# Patient Record
Sex: Female | Born: 1955 | Hispanic: Yes | Marital: Married | Smoking: Never smoker
Health system: Southern US, Community
[De-identification: ages and names within clinical notes are randomized; demographics above are authoritative.]

## PROBLEM LIST (undated history)

## (undated) DIAGNOSIS — E119 Type 2 diabetes mellitus without complications: Secondary | ICD-10-CM

## (undated) DIAGNOSIS — M1990 Unspecified osteoarthritis, unspecified site: Secondary | ICD-10-CM

---

## 2004-05-21 ENCOUNTER — Ambulatory Visit: Payer: Self-pay

## 2004-06-12 ENCOUNTER — Emergency Department: Payer: Self-pay | Admitting: Emergency Medicine

## 2005-06-03 ENCOUNTER — Ambulatory Visit: Payer: Self-pay

## 2006-10-20 ENCOUNTER — Ambulatory Visit: Payer: Self-pay

## 2007-12-06 ENCOUNTER — Ambulatory Visit: Payer: Self-pay

## 2009-03-19 ENCOUNTER — Ambulatory Visit: Payer: Self-pay

## 2009-09-17 ENCOUNTER — Ambulatory Visit: Payer: Self-pay

## 2009-10-10 LAB — PATHOLOGY REPORT

## 2010-04-30 ENCOUNTER — Ambulatory Visit: Payer: Self-pay

## 2010-05-06 ENCOUNTER — Ambulatory Visit: Payer: Self-pay

## 2010-11-24 ENCOUNTER — Emergency Department: Payer: Self-pay | Admitting: *Deleted

## 2010-11-30 ENCOUNTER — Inpatient Hospital Stay: Payer: Self-pay | Admitting: Internal Medicine

## 2011-06-24 ENCOUNTER — Ambulatory Visit: Payer: Self-pay

## 2012-06-28 ENCOUNTER — Ambulatory Visit: Payer: Self-pay | Admitting: Family Medicine

## 2012-08-14 IMAGING — CT CT STONE STUDY
1 of 2 series · 15 of 32 positions shown, 19 images · non-contrast
Comparison: none

REASON FOR EXAM: left lower quadrant pain
COMMENTS:

PROCEDURE:     CT  - CT ABDOMEN /PELVIS WO (STONE)  - November 30, 2010  [DATE]
RESULT:     Comparison: 11/24/2010
TECHNIQUE: Multiple axial images from the lung bases to the symphysis pubis
were obtained without oral and without intravenous contrast.

[Series 2: stone · axial · 0.84mm/px · z∈[-506,-77]mm · 15 of 161 slices shown, 19 images]
[im 12/161  soft-tissue]
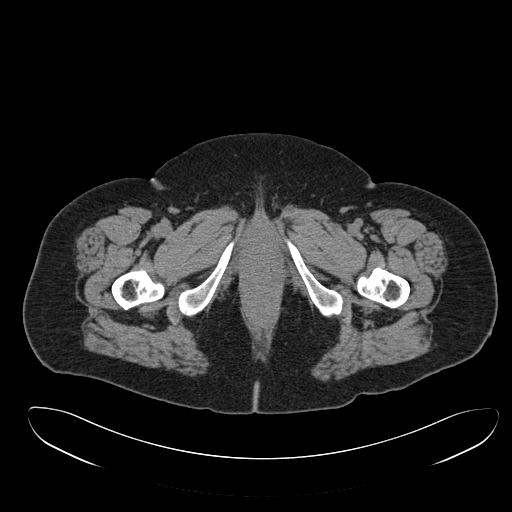
[im 12/161  bone]
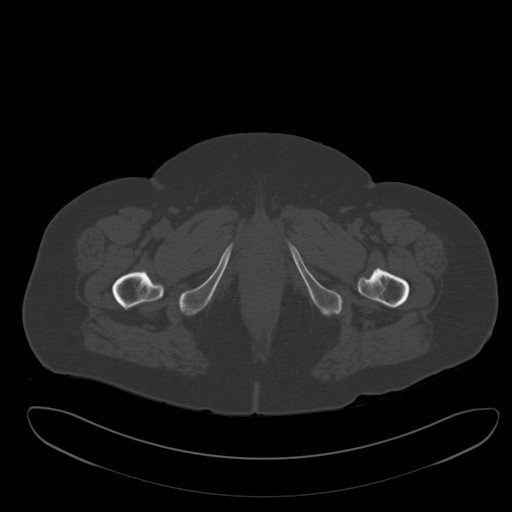
[im 24/161  soft-tissue]
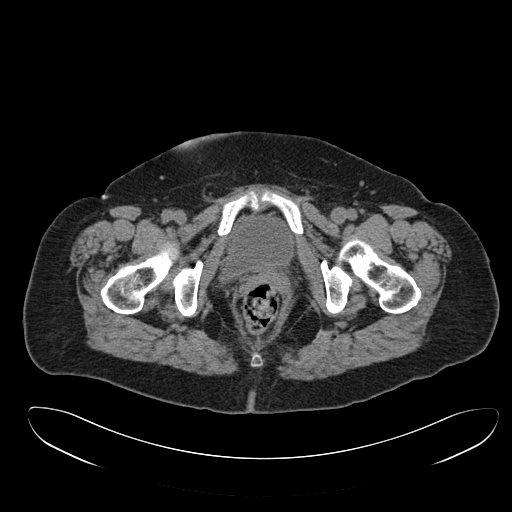
[im 36/161  soft-tissue]
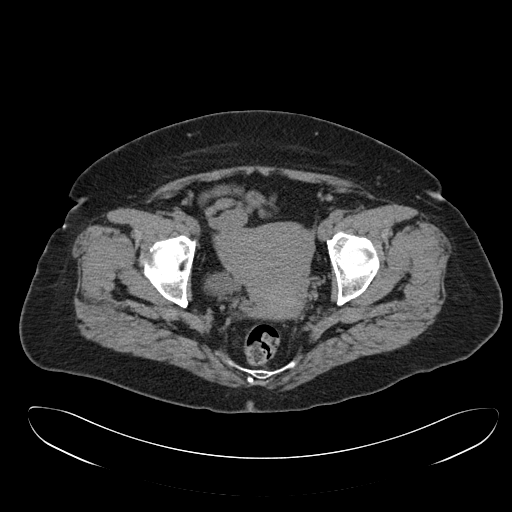
[im 48/161  soft-tissue]
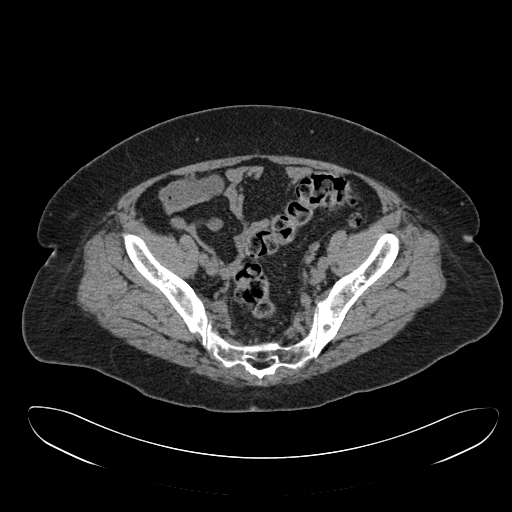
[im 60/161  soft-tissue]
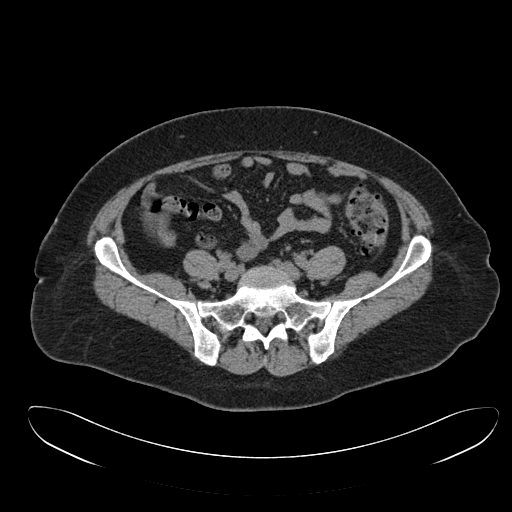
[im 72/161  soft-tissue]
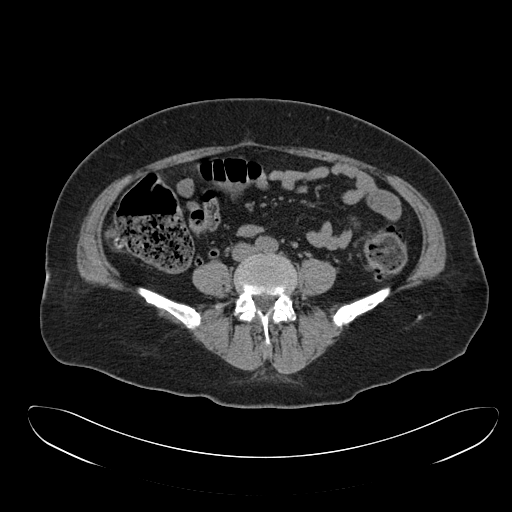
[im 83/161  soft-tissue]
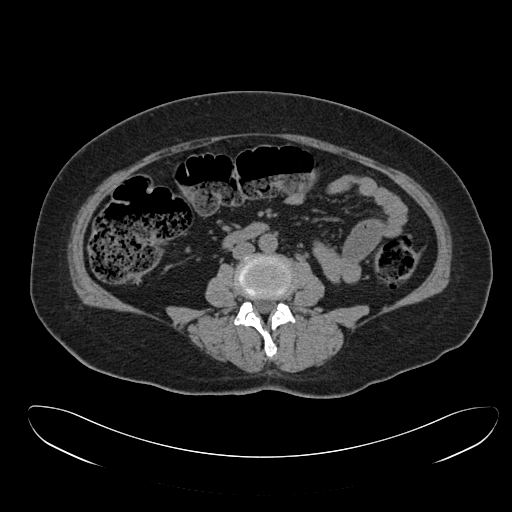
[im 95/161  soft-tissue]
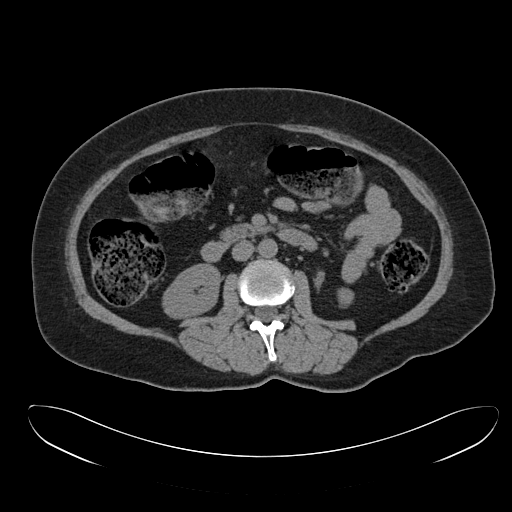
[im 107/161  soft-tissue]
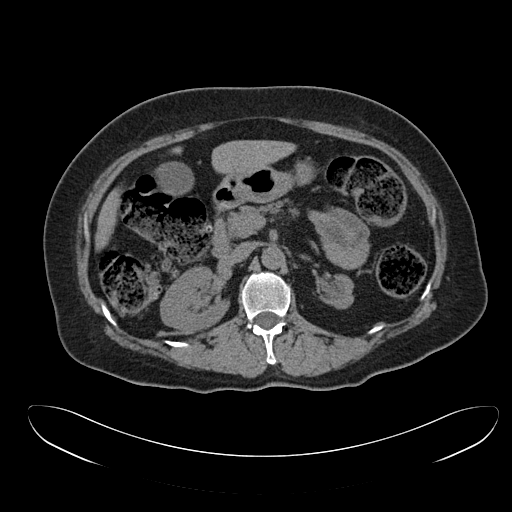
[im 107/161  bone]
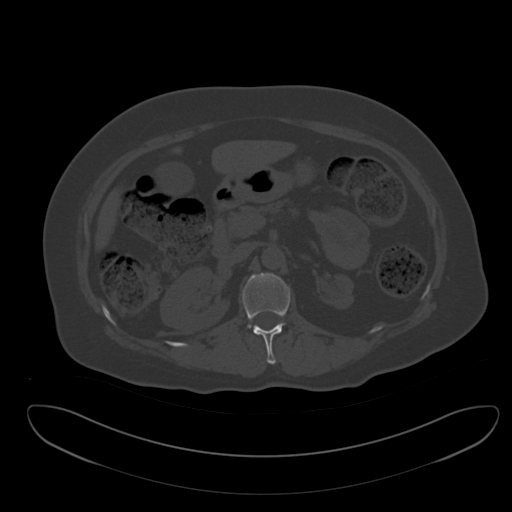
[im 119/161  soft-tissue]
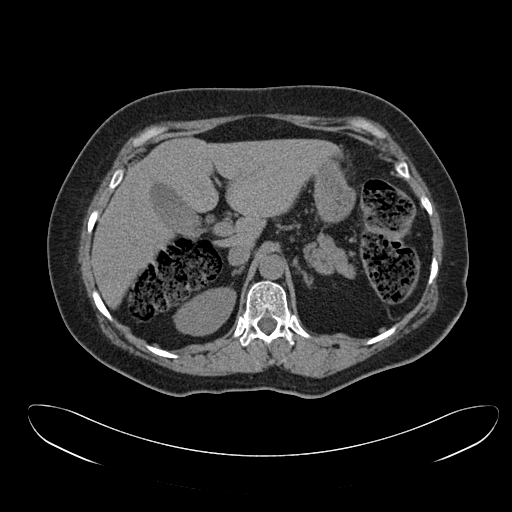
[im 131/161  soft-tissue]
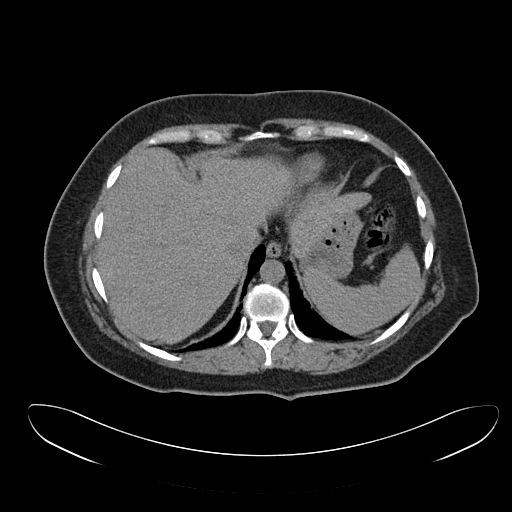
[im 137/161  lung]
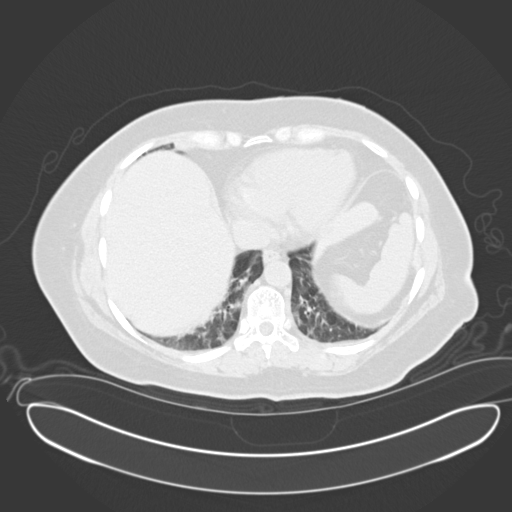
[im 143/161  soft-tissue]
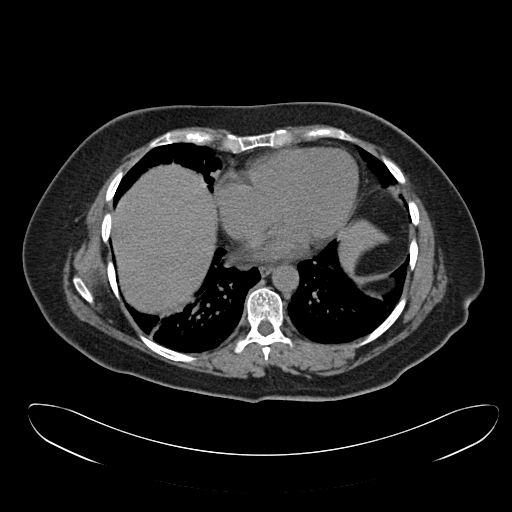
[im 143/161  lung]
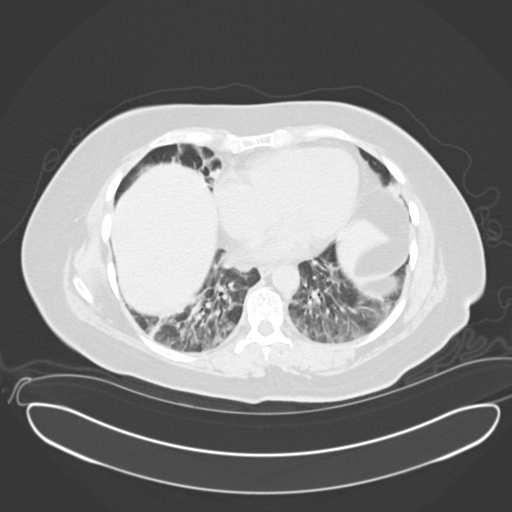
[im 149/161  lung]
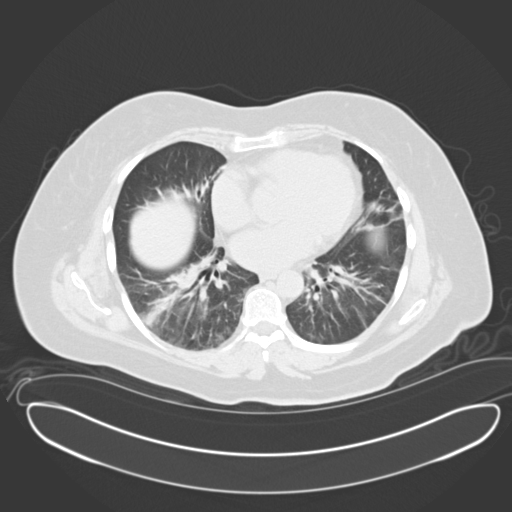
[im 155/161  soft-tissue]
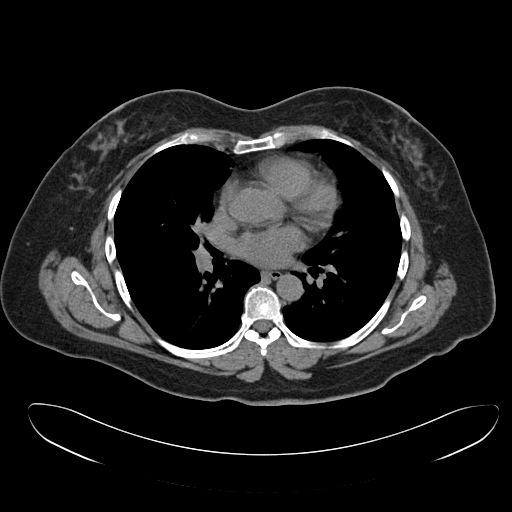
[im 155/161  lung]
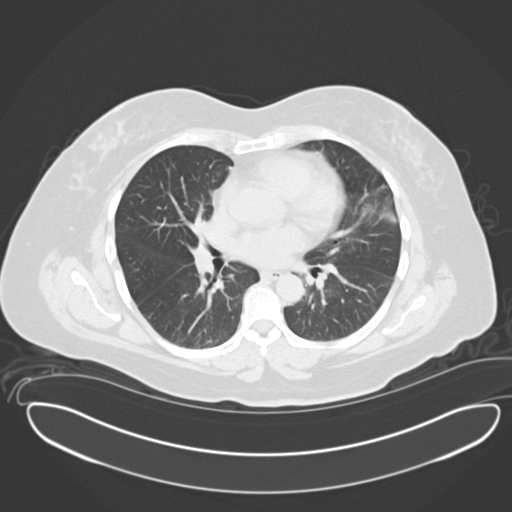

[15 of 32 positions shown; findings below may reference images not displayed]

FINDINGS: Mild basilar opacities likely represent atelectasis.

Lack of intravenous contrast limits evaluation of the solid abdominal
organs.  Grossly, the liver, spleen, adrenals, gallbladder, and pancreas are
unremarkable. No renal calculi or hydronephrosis. The left kidney is
atrophic. Low-attenuation lesions in the left kidney likely represent cysts.
The others are too small to characterize. The left renal pelvis and proximal
left ureter are mildly dilated. This dilatation is decreased from prior.

The small and large bowel are normal in caliber. There is mild
diverticulosis of the sigmoid and descending colon. There is suggestion of
minimal stranding adjacent to the junction of the sigmoid and descending
colon. Mild diverticulosis also seen in the descending colon. The appendix
is normal.

No aggressive lytic or sclerotic osseous lesions are identified.
IMPRESSION: 1. Diverticulosis of the colon, with suggestion of minimal diverticulitis at
the junction of the sigmoid and descending colon.
2. Atrophic left kidney. The left renal pelvis and proximal ureter are
mildly dilated without definite obstructing calculus. The dilatation is
decreased from prior. This may be related to resolving infection or passed
stone. If there is continued clinical concern, dedicated contrast-enhanced
renal CT could be performed.

## 2013-07-10 ENCOUNTER — Ambulatory Visit: Payer: Self-pay

## 2014-07-30 ENCOUNTER — Encounter: Payer: Self-pay | Admitting: *Deleted

## 2014-07-30 ENCOUNTER — Ambulatory Visit: Payer: Self-pay | Attending: Oncology | Admitting: *Deleted

## 2014-07-30 ENCOUNTER — Ambulatory Visit
Admission: RE | Admit: 2014-07-30 | Discharge: 2014-07-30 | Disposition: A | Discharge status: Home / Self Care | Payer: Self-pay | Source: Ambulatory Visit | Attending: Oncology | Admitting: Oncology

## 2014-07-30 VITALS — BP 139/83 | HR 90 | Temp 97.2°F | Ht 61.42 in | Wt 170.0 lb

## 2014-07-30 DIAGNOSIS — Z0000 Encounter for general adult medical examination without abnormal findings: Secondary | ICD-10-CM

## 2014-07-30 NOTE — Progress Notes (Signed)
Subjective:     Patient ID: Autumn Patty, female   DOB: 13-Dec-1955, 59 y.o.   MRN: [id number]  HPI   Review of Systems     Objective:   Physical Exam  Pulmonary/Chest: Right breast exhibits no inverted nipple, no mass, no nipple discharge, no skin change and no tenderness. Left breast exhibits no inverted nipple, no mass, no nipple discharge, no skin change and no tenderness. Breasts are symmetrical.         Assessment:     59 year old [place] female returns to [facility] for annual exam.  Clinical breast exam unremarkable.  Lloyda, the interpreter present during the interview and exam.  Taught breast self awareness.  Patient is due for her next pap smear.  Patient has been screened for eligibility.  She does not have any insurance, Medicare or Medicaid.  She also meets financial eligibility.  Hand-out given on the Affordable Care Act.     Plan:       Screening mammogram ordered.  Pap smear scheduled for 08/22/14 @ 8:30.

## 2014-07-31 ENCOUNTER — Encounter: Payer: Self-pay | Admitting: *Deleted

## 2014-07-31 NOTE — Progress Notes (Signed)
Letter mailed to inform patient of her normal mammogram.  She is to follow-up in one year with next screening.

## 2014-08-08 ENCOUNTER — Ambulatory Visit: Payer: Self-pay

## 2014-08-22 ENCOUNTER — Other Ambulatory Visit: Payer: Self-pay

## 2014-08-22 ENCOUNTER — Ambulatory Visit: Payer: Self-pay | Attending: Oncology

## 2014-08-22 DIAGNOSIS — Z0000 Encounter for general adult medical examination without abnormal findings: Secondary | ICD-10-CM

## 2014-08-22 NOTE — Progress Notes (Signed)
Subjective:     Patient ID: Kelly Juarez, female   DOB: 01/14/1956, 59 y.o.   MRN: [id number]  HPI   Review of Systems     Objective:   Physical Exam  Genitourinary: No labial fusion. There is no rash, tenderness, lesion or injury on the right labia. There is no rash, tenderness, lesion or injury on the left labia. No erythema, tenderness or bleeding in the vagina. No foreign body around the vagina. No signs of injury around the vagina. No vaginal discharge found.       Assessment:  59 year old hispanic patient [facility] Pelvic exam normal.  Specimen collected for pap.   Plan:          Will follow per protocol

## 2014-08-25 LAB — PAP LB AND HPV HIGH-RISK
HPV, HIGH-RISK: NEGATIVE
PAP Smear Comment: 0

## 2014-09-05 NOTE — Progress Notes (Signed)
Patient ID: Kelly Juarez, female   DOB: April 27, 1955, 59 y.o.   MRN: [id number] Letter mailed to patient to notify of normal mammogram, and pap smear results.Patient  Instructed to return for annual screeningCopy to [facility].

## 2015-08-01 ENCOUNTER — Other Ambulatory Visit: Payer: Self-pay | Admitting: Family Medicine

## 2015-08-01 DIAGNOSIS — Z1231 Encounter for screening mammogram for malignant neoplasm of breast: Secondary | ICD-10-CM

## 2015-08-15 ENCOUNTER — Other Ambulatory Visit: Payer: Self-pay | Admitting: Family Medicine

## 2015-08-15 ENCOUNTER — Ambulatory Visit
Admission: RE | Admit: 2015-08-15 | Discharge: 2015-08-15 | Disposition: A | Discharge status: Home / Self Care | Payer: BLUE CROSS/BLUE SHIELD | Source: Ambulatory Visit | Attending: Family Medicine | Admitting: Family Medicine

## 2015-08-15 DIAGNOSIS — Z1231 Encounter for screening mammogram for malignant neoplasm of breast: Secondary | ICD-10-CM

## 2016-07-23 ENCOUNTER — Other Ambulatory Visit: Payer: Self-pay | Admitting: Family Medicine

## 2016-07-23 DIAGNOSIS — Z1231 Encounter for screening mammogram for malignant neoplasm of breast: Secondary | ICD-10-CM

## 2016-08-18 ENCOUNTER — Ambulatory Visit
Admission: RE | Admit: 2016-08-18 | Discharge: 2016-08-18 | Disposition: A | Discharge status: Home / Self Care | Payer: BLUE CROSS/BLUE SHIELD | Source: Ambulatory Visit | Attending: Family Medicine | Admitting: Family Medicine

## 2016-08-18 DIAGNOSIS — Z1231 Encounter for screening mammogram for malignant neoplasm of breast: Secondary | ICD-10-CM | POA: Insufficient documentation

## 2017-07-22 ENCOUNTER — Other Ambulatory Visit: Payer: Self-pay | Admitting: Family Medicine

## 2017-07-22 DIAGNOSIS — Z1231 Encounter for screening mammogram for malignant neoplasm of breast: Secondary | ICD-10-CM

## 2017-08-31 ENCOUNTER — Ambulatory Visit
Admission: RE | Admit: 2017-08-31 | Discharge: 2017-08-31 | Disposition: A | Discharge status: Home / Self Care | Payer: BLUE CROSS/BLUE SHIELD | Source: Ambulatory Visit | Attending: Family Medicine | Admitting: Family Medicine

## 2017-08-31 DIAGNOSIS — Z1231 Encounter for screening mammogram for malignant neoplasm of breast: Secondary | ICD-10-CM | POA: Diagnosis present

## 2018-07-27 ENCOUNTER — Other Ambulatory Visit: Payer: Self-pay | Admitting: Family Medicine

## 2018-07-27 DIAGNOSIS — Z1231 Encounter for screening mammogram for malignant neoplasm of breast: Secondary | ICD-10-CM

## 2018-09-06 ENCOUNTER — Ambulatory Visit
Admission: RE | Admit: 2018-09-06 | Discharge: 2018-09-06 | Disposition: A | Discharge status: Home / Self Care | Payer: BLUE CROSS/BLUE SHIELD | Source: Ambulatory Visit | Attending: Family Medicine | Admitting: Family Medicine

## 2018-09-06 DIAGNOSIS — Z1231 Encounter for screening mammogram for malignant neoplasm of breast: Secondary | ICD-10-CM | POA: Diagnosis not present

## 2019-01-03 ENCOUNTER — Ambulatory Visit (INDEPENDENT_AMBULATORY_CARE_PROVIDER_SITE_OTHER): Payer: BLUE CROSS/BLUE SHIELD | Admitting: Urology

## 2019-01-03 ENCOUNTER — Other Ambulatory Visit: Payer: Self-pay

## 2019-01-03 DIAGNOSIS — N2889 Other specified disorders of kidney and ureter: Secondary | ICD-10-CM

## 2019-01-03 NOTE — Progress Notes (Signed)
Patient presented today for further evaluation of an incidental enhancing renal mass.  Scans were done at the outside at [facility].  The patient did go get a disc but brought it to her primary care physician's office rather than here.  We discussed that without actually being able to review her visualize the images, we will only be able to have a somewhat superficial discussion.  She was offered to keep the appointment today to go ahead and have this discussion versus reschedule for later date with the disc at which time we can have a more informed discussion and discuss treatment options.  She would like to reschedule.  Will work to get the images on disc prior to this rescheduled appointment time and date.  Please ensure that the co-pay that she paid today is corrected to her rescheduled visit.  Hollice Espy, MD

## 2019-01-04 ENCOUNTER — Telehealth: Payer: Self-pay | Admitting: Urology

## 2019-01-04 NOTE — Telephone Encounter (Signed)
Pt's granddaughter called and asked if we had signed up for [facility] ? In order to get the Pt's medical information. She asked for a call back to discuss. [phone] Danae Chen

## 2019-01-11 NOTE — Telephone Encounter (Signed)
Left VM to return call 

## 2019-01-11 NOTE — Telephone Encounter (Signed)
We don't have access to [facility] at this time.  Will take several weeks at least.  Please make sure this patient is rescheduled for sometimes in January.  Hollice Espy, MD

## 2019-01-11 NOTE — Telephone Encounter (Signed)
Spoke with granddaughter she contacted [facility] regarding getting a disc. They informed her the best way to get images quickly is through [place].

## 2019-01-11 NOTE — Telephone Encounter (Signed)
Pt had [employer] call us and they need for someone to call to discuss what the plan is for now, the pt needs to be seen before the end of the year (Per Pt)  [employer] ph # [phone] Evelena Peat

## 2019-01-11 NOTE — Telephone Encounter (Signed)
Spoke with granddaughter, Erica-scheduled follow up in January. Verbalized understanding.

## 2019-01-18 NOTE — Telephone Encounter (Signed)
Called to schedule appointment to discuss surgery. Patient states she is going to have surgery at [facility] because of insurance.

## 2019-01-20 ENCOUNTER — Ambulatory Visit: Payer: BLUE CROSS/BLUE SHIELD | Admitting: Urology

## 2019-02-21 ENCOUNTER — Ambulatory Visit: Payer: Self-pay | Admitting: Urology

## 2019-08-01 ENCOUNTER — Other Ambulatory Visit: Payer: Self-pay | Admitting: Family Medicine

## 2019-08-01 DIAGNOSIS — Z13820 Encounter for screening for osteoporosis: Secondary | ICD-10-CM

## 2019-08-01 DIAGNOSIS — Z1231 Encounter for screening mammogram for malignant neoplasm of breast: Secondary | ICD-10-CM

## 2019-09-07 ENCOUNTER — Ambulatory Visit
Admission: RE | Admit: 2019-09-07 | Discharge: 2019-09-07 | Disposition: A | Discharge status: Home / Self Care | Payer: Medicare Other | Source: Ambulatory Visit | Attending: Family Medicine | Admitting: Family Medicine

## 2019-09-07 DIAGNOSIS — Z780 Asymptomatic menopausal state: Secondary | ICD-10-CM | POA: Insufficient documentation

## 2019-09-07 DIAGNOSIS — Z1231 Encounter for screening mammogram for malignant neoplasm of breast: Secondary | ICD-10-CM | POA: Diagnosis present

## 2019-09-07 DIAGNOSIS — E119 Type 2 diabetes mellitus without complications: Secondary | ICD-10-CM | POA: Diagnosis not present

## 2019-09-07 DIAGNOSIS — Z13820 Encounter for screening for osteoporosis: Secondary | ICD-10-CM | POA: Insufficient documentation

## 2020-08-02 ENCOUNTER — Other Ambulatory Visit: Payer: Self-pay | Admitting: Family Medicine

## 2020-08-02 DIAGNOSIS — Z1231 Encounter for screening mammogram for malignant neoplasm of breast: Secondary | ICD-10-CM

## 2020-09-09 ENCOUNTER — Other Ambulatory Visit: Payer: Self-pay

## 2020-09-09 ENCOUNTER — Ambulatory Visit
Admission: RE | Admit: 2020-09-09 | Discharge: 2020-09-09 | Disposition: A | Discharge status: Home / Self Care | Payer: Medicare Other | Source: Ambulatory Visit | Attending: Family Medicine | Admitting: Family Medicine

## 2020-09-09 DIAGNOSIS — Z1231 Encounter for screening mammogram for malignant neoplasm of breast: Secondary | ICD-10-CM | POA: Diagnosis not present

## 2021-08-07 ENCOUNTER — Other Ambulatory Visit: Payer: Self-pay | Admitting: Family Medicine

## 2021-08-07 DIAGNOSIS — Z780 Asymptomatic menopausal state: Secondary | ICD-10-CM

## 2021-08-07 DIAGNOSIS — M8580 Other specified disorders of bone density and structure, unspecified site: Secondary | ICD-10-CM

## 2021-08-07 DIAGNOSIS — Z1231 Encounter for screening mammogram for malignant neoplasm of breast: Secondary | ICD-10-CM

## 2021-10-15 ENCOUNTER — Ambulatory Visit
Admission: RE | Admit: 2021-10-15 | Discharge: 2021-10-15 | Disposition: A | Discharge status: Home / Self Care | Payer: Medicare Other | Source: Ambulatory Visit | Attending: Family Medicine | Admitting: Family Medicine

## 2021-10-15 DIAGNOSIS — Z780 Asymptomatic menopausal state: Secondary | ICD-10-CM | POA: Insufficient documentation

## 2021-10-15 DIAGNOSIS — Z1231 Encounter for screening mammogram for malignant neoplasm of breast: Secondary | ICD-10-CM

## 2021-10-15 DIAGNOSIS — M8580 Other specified disorders of bone density and structure, unspecified site: Secondary | ICD-10-CM | POA: Diagnosis present

## 2022-05-25 IMAGING — MG MM DIGITAL SCREENING BILAT W/ TOMO AND CAD
6 of 10 series · 6 of 30 positions shown · non-contrast
Comparison: Previous exam(s).

CLINICAL DATA: Screening.

EXAM:
DIGITAL SCREENING BILATERAL MAMMOGRAM WITH TOMOSYNTHESIS AND CAD
TECHNIQUE: Bilateral screening digital craniocaudal and mediolateral oblique
mammograms were obtained. Bilateral screening digital breast
tomosynthesis was performed. The images were evaluated with
computer-aided detection.

[R MLO synth-2D]
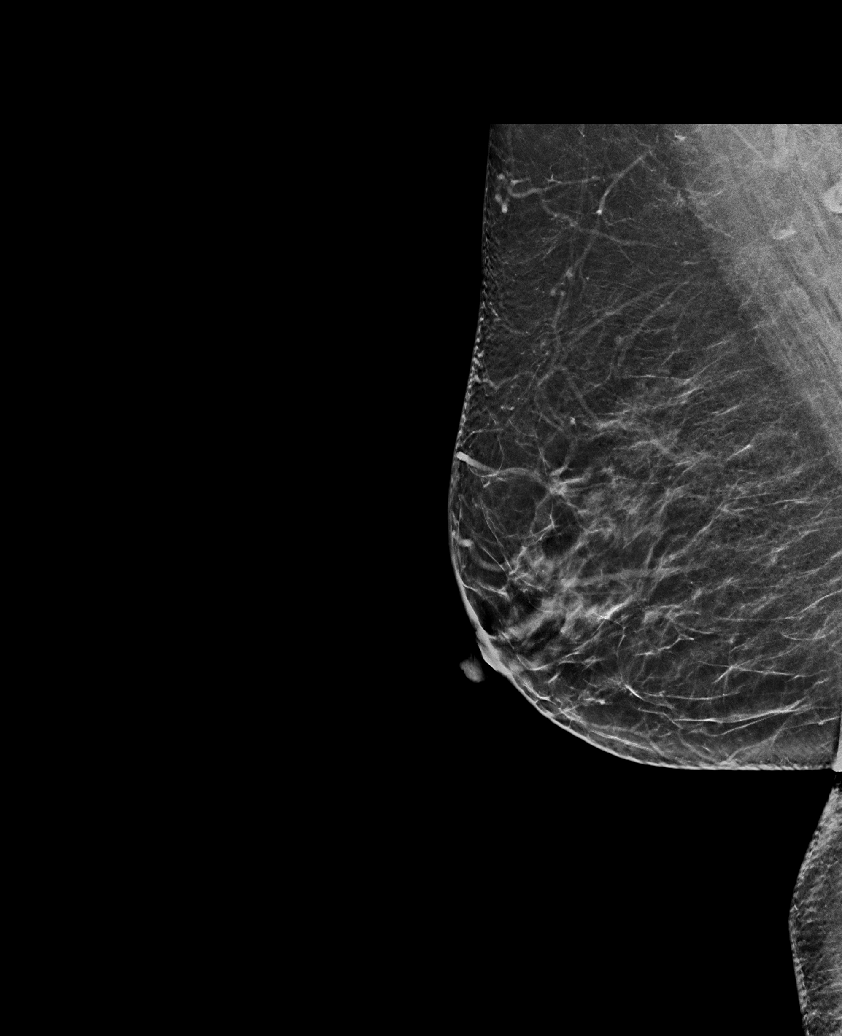

[L MLO synth-2D (1 of 2)]
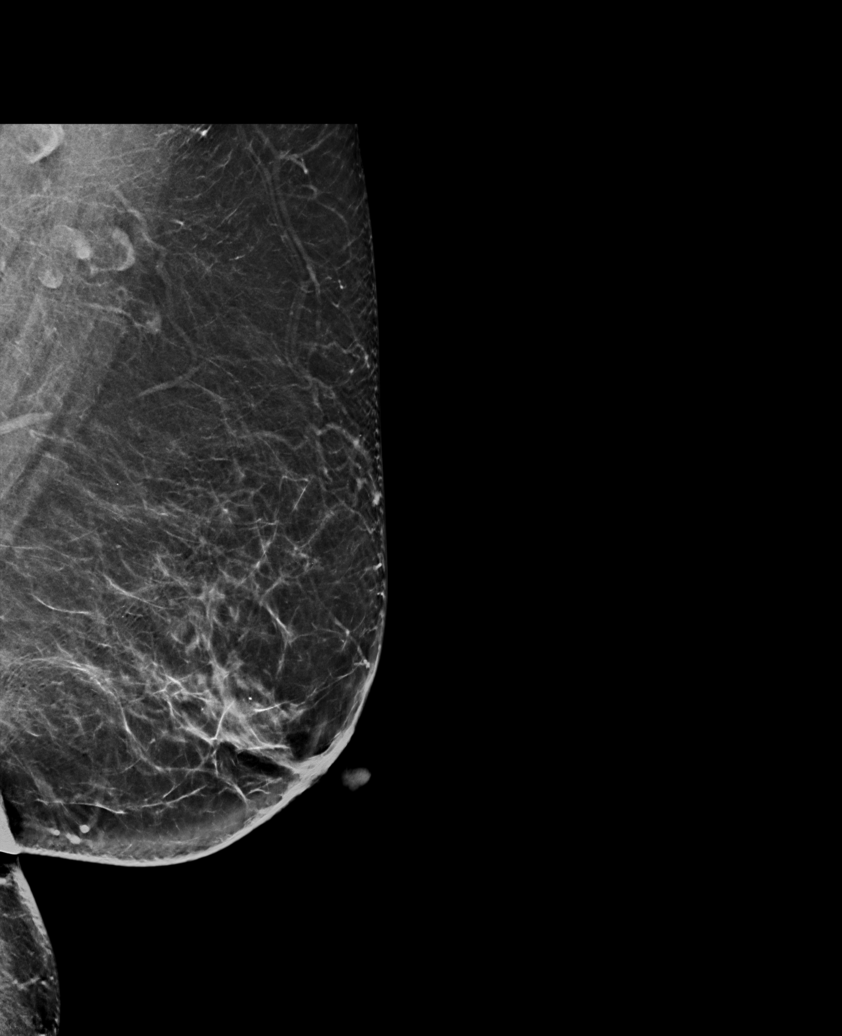

[L CC synth-2D]
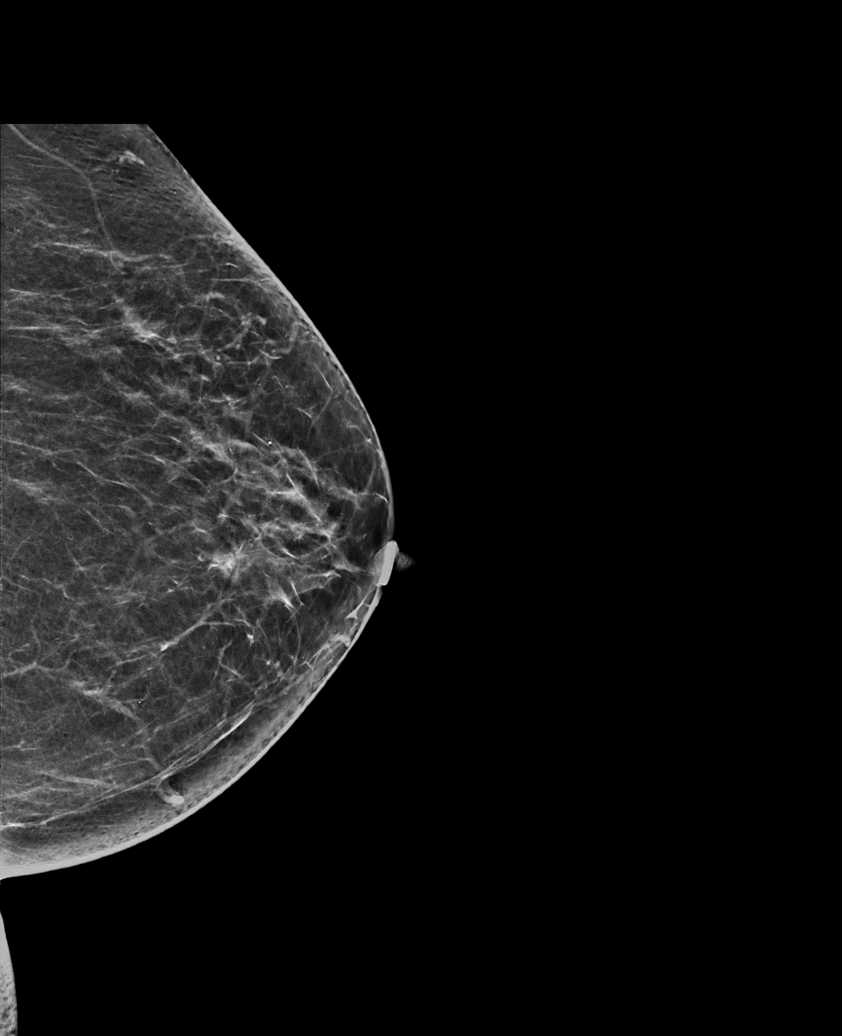

[R CC synth-2D]
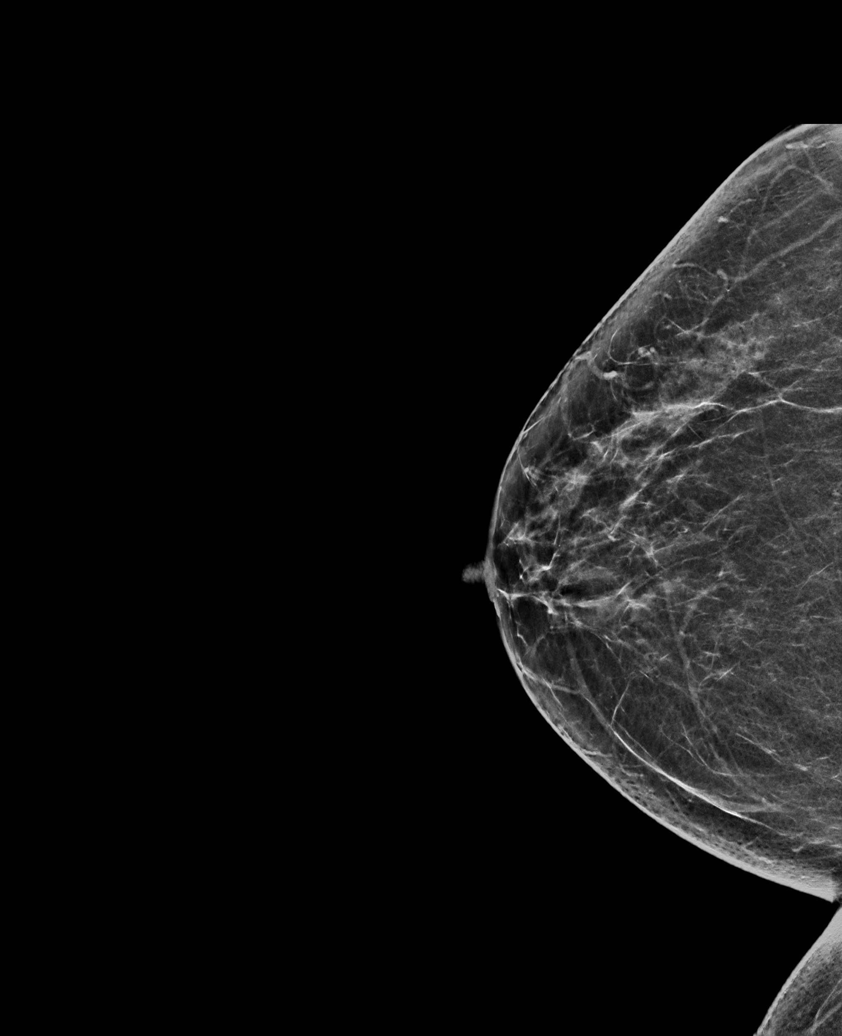

[L MLO synth-2D (2 of 2)]
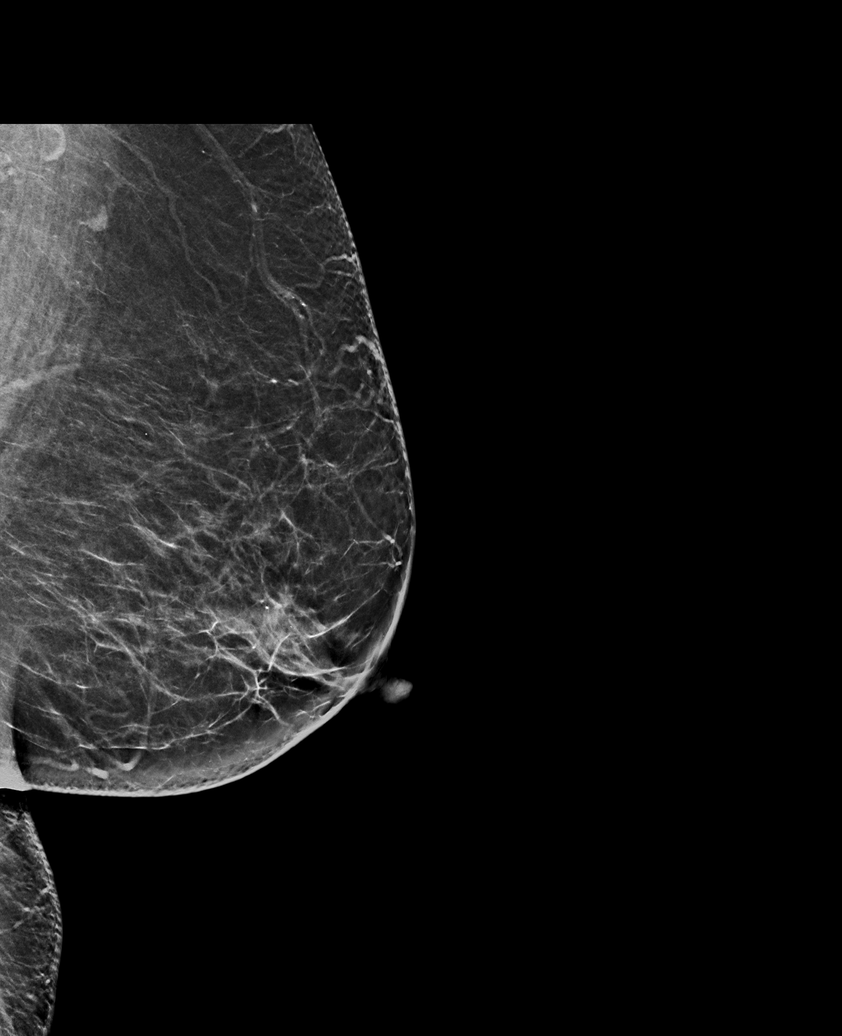

[L MLO tomo · tomo slice 38/75.0]
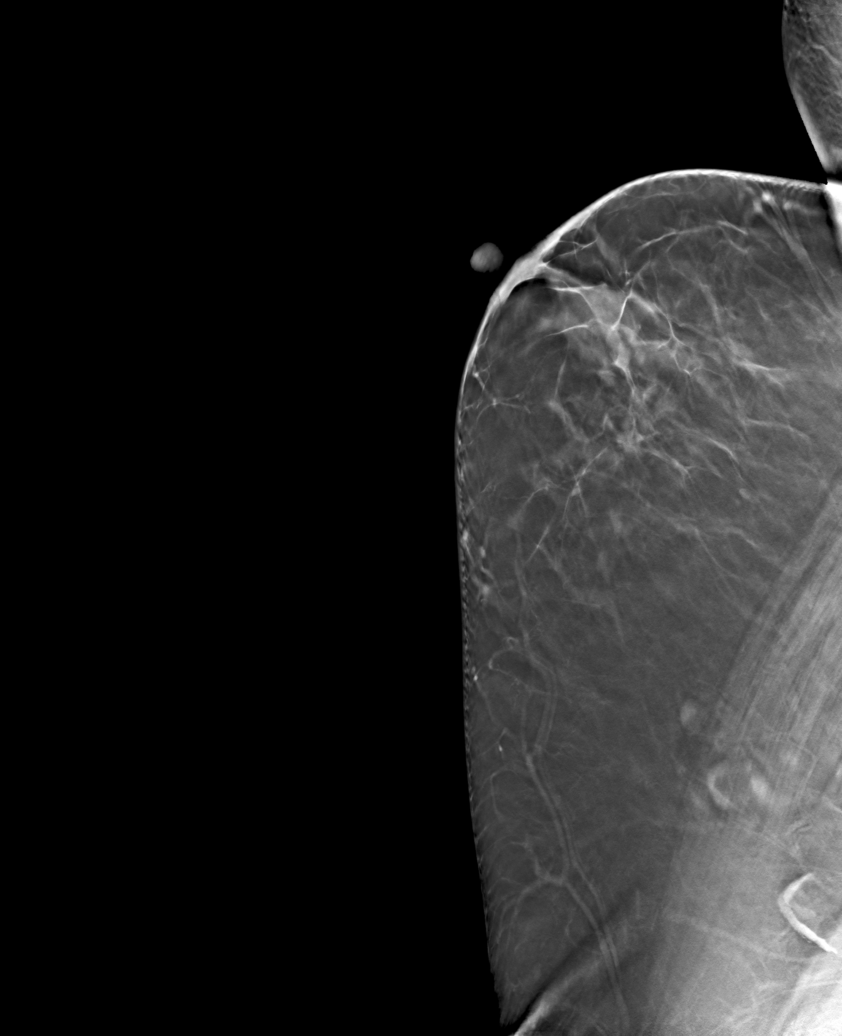

[6 of 30 positions shown; findings below may reference images not displayed]

ACR Breast Density Category b: There are scattered areas of
fibroglandular density.
FINDINGS: There are no findings suspicious for malignancy.
IMPRESSION: No mammographic evidence of malignancy. A result letter of this
screening mammogram will be mailed directly to the patient.

RECOMMENDATION:
Screening mammogram in one year. (Code:51-O-LD2)

BI-RADS CATEGORY  1: Negative.

## 2022-07-07 ENCOUNTER — Ambulatory Visit
Admission: RE | Admit: 2022-07-07 | Discharge: 2022-07-07 | Disposition: A | Discharge status: Home / Self Care | Payer: Medicare Other | Source: Ambulatory Visit

## 2022-07-07 VITALS — BP 131/86 | HR 103 | Temp 99.1°F | Resp 18

## 2022-07-07 DIAGNOSIS — H6692 Otitis media, unspecified, left ear: Secondary | ICD-10-CM | POA: Diagnosis not present

## 2022-07-07 DIAGNOSIS — J209 Acute bronchitis, unspecified: Secondary | ICD-10-CM | POA: Diagnosis not present

## 2022-07-07 DIAGNOSIS — J0100 Acute maxillary sinusitis, unspecified: Secondary | ICD-10-CM

## 2022-07-07 HISTORY — DX: Type 2 diabetes mellitus without complications: E11.9

## 2022-07-07 HISTORY — DX: Unspecified osteoarthritis, unspecified site: M19.90

## 2022-07-07 MED ORDER — AMOXICILLIN 875 MG PO TABS: 875.0000 mg | ORAL_TABLET | Freq: Two times a day (BID) | ORAL | 0 refills | Status: AC

## 2022-07-07 NOTE — ED Triage Notes (Signed)
Patient to Urgent Care with complaints of productive cough (with green mucus), fevers, headaches, and left sided ear fullness/ muffled hearing.  Reports symptoms started one week ago. Diagnosed with a vial URI @[facility] on 5/24 (negative for covid/ flu/ strep) but has not felt better.   Taking tylenol (last dose 1g at 2am).

## 2022-07-07 NOTE — Discharge Instructions (Addendum)
Take the amoxicillin as directed.  Follow up with your primary care provider if your symptoms are not improving.   ° ° °

## 2022-07-07 NOTE — ED Provider Notes (Signed)
Renaldo Fiddler    CSN: [id number] Arrival date & time: 07/07/22  0807      History   Chief Complaint Chief Complaint  Patient presents with   Cough    Fever, stuffy nose, cough, ear clogged up - Entered by patient    HPI Kelly Juarez is a 67 y.o. female.  Patient presents with subjective fever, headache, ear pain, congestion, cough x 1 week.  Taking Tylenol; last dose at 0200. Took ibuprofen last night.  She denies rash, sore throat, shortness of breath, chest pain, or other symptoms.  Patient was seen at [facility] on 07/03/2022; diagnosed with viral URI and sore throat; treated with Tessalon Perles.  Her medical history includes diabetes.    The history is provided by the patient and medical records. A language interpreter was used.    Past Medical History:  Diagnosis Date   Arthritis    Diabetes mellitus without complication (HCC)     There are no problems to display for this patient.   History reviewed. No pertinent surgical history.  OB History   No obstetric history on file.      Home Medications    Prior to Admission medications   Medication Sig Start Date End Date Taking? Authorizing Provider  amoxicillin (AMOXIL) 875 MG tablet Take 1 tablet (875 mg total) by mouth 2 (two) times daily for 10 days. 07/07/22 07/17/22 Yes Mickie Bail, NP  atorvastatin (LIPITOR) 40 MG tablet  03/30/19  Yes [provider]  benzonatate (TESSALON) 200 MG capsule Take by mouth. 07/03/22 07/10/22 Yes [provider]  TRADJENTA 5 MG TABS tablet  01/16/21  Yes [provider]  alendronate (FOSAMAX) 70 MG tablet Take 70 mg by mouth once a week.    [provider]  Calcium Carb-Cholecalciferol 600-10 MG-MCG TABS Take 1 tablet by mouth 2 (two) times daily.    [provider]  cetirizine (ZYRTEC) 10 MG tablet Take by mouth.    [provider]  omeprazole (PRILOSEC) 20 MG capsule Take by mouth 2 (two) times daily.    [provider]    Family History Family History  Problem Relation Age of Onset   Breast cancer Neg Hx     Social History Social History   Tobacco Use   Smoking status: Never   Smokeless tobacco: Never  Substance Use Topics   Alcohol use: Not Currently   Drug use: Never     Allergies   Ciprofloxacin   Review of Systems Review of Systems  Constitutional:  Positive for fever. Negative for chills.  HENT:  Positive for congestion and ear pain. Negative for sore throat.   Respiratory:  Positive for cough. Negative for shortness of breath.   Cardiovascular:  Negative for chest pain and palpitations.  Gastrointestinal:  Negative for diarrhea and vomiting.  Skin:  Negative for color change and rash.  All other systems reviewed and are negative.    Physical Exam Triage Vital Signs ED Triage Vitals  Enc Vitals Group     BP      Pulse      Resp      Temp      Temp src      SpO2      Weight      Height      Head Circumference      Peak Flow      Pain Score      Pain Loc  Pain Edu?      Excl. in GC?    No data found.  Updated Vital Signs BP 131/86   Pulse (!) 103   Temp 99.1 F (37.3 C)   Resp 18   SpO2 95%   Visual Acuity Right Eye Distance:   Left Eye Distance:   Bilateral Distance:    Right Eye Near:   Left Eye Near:    Bilateral Near:     Physical Exam Vitals and nursing note reviewed.  Constitutional:      General: She is not in acute distress.    Appearance: Normal appearance. She is well-developed. She is not ill-appearing.  HENT:     Right Ear: Tympanic membrane normal.     Left Ear: Tympanic membrane is erythematous.     Nose: Congestion and rhinorrhea present.     Mouth/Throat:     Mouth: Mucous membranes are moist.     Pharynx: Oropharynx is clear.  Cardiovascular:     Rate and Rhythm: Normal rate and regular rhythm.     Heart sounds: Normal heart sounds.  Pulmonary:     Effort: Pulmonary effort is normal. No respiratory  distress.     Breath sounds: Normal breath sounds.  Musculoskeletal:     Cervical back: Neck supple.  Skin:    General: Skin is warm and dry.  Neurological:     Mental Status: She is alert.  Psychiatric:        Mood and Affect: Mood normal.        Behavior: Behavior normal.      UC Treatments / Results  Labs (all labs ordered are listed, but only abnormal results are displayed) Labs Reviewed - No data to display  EKG   Radiology No results found.  Procedures Procedures (including critical care time)  Medications Ordered in UC Medications - No data to display  Initial Impression / Assessment and Plan / UC Course  I have reviewed the triage vital signs and the nursing notes.  Pertinent labs & imaging results that were available during my care of the patient were reviewed by me and considered in my medical decision making (see chart for details).    Left otitis media, acute sinusitis, acute bronchitis.  Treating with amoxicillin.  Discussed symptomatic treatment including Tylenol or ibuprofen.  Instructed patient to follow up with her PCP if symptoms are not improving.  She agrees to plan of care.   Final Clinical Impressions(s) / UC Diagnoses   Final diagnoses:  Left otitis media, unspecified otitis media type  Acute non-recurrent maxillary sinusitis  Acute bronchitis, unspecified organism     Discharge Instructions      Take the amoxicillin as directed.  Follow up with your primary care provider if your symptoms are not improving.        ED Prescriptions     Medication Sig Dispense Auth. Provider   amoxicillin (AMOXIL) 875 MG tablet Take 1 tablet (875 mg total) by mouth 2 (two) times daily for 10 days. 20 tablet Mickie Bail, NP      PDMP not reviewed this encounter.   Mickie Bail, NP 07/07/22 [phone]

## 2022-07-08 ENCOUNTER — Telehealth: Payer: Self-pay

## 2022-07-08 NOTE — Telephone Encounter (Signed)
Patient calls clinic regarding yesterday's visit for ear infection. Patient called requesting ear drops for her ear infection. Provider notes reviewed and education provided to patient. I instructed her to take the amoxicillin which is the medication prescribed for the ear infection. Voiced understanding.

## 2022-09-25 ENCOUNTER — Other Ambulatory Visit: Payer: Self-pay | Admitting: Family Medicine

## 2022-09-25 DIAGNOSIS — Z1231 Encounter for screening mammogram for malignant neoplasm of breast: Secondary | ICD-10-CM

## 2022-10-19 ENCOUNTER — Ambulatory Visit
Admission: RE | Admit: 2022-10-19 | Discharge: 2022-10-19 | Disposition: A | Discharge status: Home / Self Care | Payer: Medicare Other | Source: Ambulatory Visit | Attending: Family Medicine | Admitting: Family Medicine

## 2022-10-19 DIAGNOSIS — Z1231 Encounter for screening mammogram for malignant neoplasm of breast: Secondary | ICD-10-CM | POA: Insufficient documentation

## 2023-08-17 ENCOUNTER — Other Ambulatory Visit: Payer: Self-pay | Admitting: Family Medicine

## 2023-08-17 DIAGNOSIS — Z1231 Encounter for screening mammogram for malignant neoplasm of breast: Secondary | ICD-10-CM

## 2023-10-20 ENCOUNTER — Ambulatory Visit
Admission: RE | Admit: 2023-10-20 | Discharge: 2023-10-20 | Disposition: A | Discharge status: Home / Self Care | Source: Ambulatory Visit | Attending: Family Medicine | Admitting: Family Medicine

## 2023-10-20 DIAGNOSIS — Z1231 Encounter for screening mammogram for malignant neoplasm of breast: Secondary | ICD-10-CM | POA: Diagnosis present
